# Patient Record
Sex: Female | Born: 1960
Health system: Southern US, Community
[De-identification: ages and names within clinical notes are randomized; demographics above are authoritative.]

## PROBLEM LIST (undated history)

## (undated) DIAGNOSIS — T7840XA Allergy, unspecified, initial encounter: Secondary | ICD-10-CM

## (undated) DIAGNOSIS — I10 Essential (primary) hypertension: Secondary | ICD-10-CM

## (undated) DIAGNOSIS — K219 Gastro-esophageal reflux disease without esophagitis: Secondary | ICD-10-CM

## (undated) DIAGNOSIS — G43909 Migraine, unspecified, not intractable, without status migrainosus: Secondary | ICD-10-CM

## (undated) DIAGNOSIS — F419 Anxiety disorder, unspecified: Secondary | ICD-10-CM

## (undated) HISTORY — DX: Gastro-esophageal reflux disease without esophagitis: K21.9

## (undated) HISTORY — DX: Essential (primary) hypertension: I10

## (undated) HISTORY — DX: Migraine, unspecified, not intractable, without status migrainosus: G43.909

## (undated) HISTORY — DX: Anxiety disorder, unspecified: F41.9

## (undated) HISTORY — DX: Allergy, unspecified, initial encounter: T78.40XA

---

## 2004-03-06 ENCOUNTER — Ambulatory Visit: Payer: Self-pay

## 2004-10-17 ENCOUNTER — Emergency Department (HOSPITAL_COMMUNITY): Admission: EM | Admit: 2004-10-17 | Discharge: 2004-10-18 | Payer: Self-pay | Admitting: Emergency Medicine

## 2005-06-04 ENCOUNTER — Ambulatory Visit: Payer: Self-pay

## 2006-08-26 ENCOUNTER — Ambulatory Visit: Payer: Self-pay

## 2007-11-30 ENCOUNTER — Ambulatory Visit: Payer: Self-pay

## 2009-06-21 ENCOUNTER — Ambulatory Visit: Payer: Self-pay | Admitting: Family Medicine

## 2010-07-19 ENCOUNTER — Other Ambulatory Visit (HOSPITAL_COMMUNITY): Payer: Self-pay | Admitting: Internal Medicine

## 2010-07-19 DIAGNOSIS — Z1231 Encounter for screening mammogram for malignant neoplasm of breast: Secondary | ICD-10-CM

## 2010-08-13 ENCOUNTER — Ambulatory Visit (HOSPITAL_COMMUNITY)
Admission: RE | Admit: 2010-08-13 | Discharge: 2010-08-13 | Disposition: A | Discharge status: Home / Self Care | Payer: Self-pay | Source: Ambulatory Visit | Attending: Internal Medicine | Admitting: Internal Medicine

## 2010-08-13 DIAGNOSIS — Z1231 Encounter for screening mammogram for malignant neoplasm of breast: Secondary | ICD-10-CM

## 2011-08-20 ENCOUNTER — Other Ambulatory Visit (HOSPITAL_COMMUNITY): Payer: Self-pay | Admitting: Internal Medicine

## 2011-09-19 ENCOUNTER — Other Ambulatory Visit (HOSPITAL_COMMUNITY): Payer: Self-pay | Admitting: Internal Medicine

## 2011-09-19 DIAGNOSIS — Z1231 Encounter for screening mammogram for malignant neoplasm of breast: Secondary | ICD-10-CM

## 2011-10-02 ENCOUNTER — Ambulatory Visit (HOSPITAL_COMMUNITY)
Admission: RE | Admit: 2011-10-02 | Discharge: 2011-10-02 | Disposition: A | Discharge status: Home / Self Care | Payer: Self-pay | Source: Ambulatory Visit | Attending: Internal Medicine | Admitting: Internal Medicine

## 2011-10-02 DIAGNOSIS — Z1231 Encounter for screening mammogram for malignant neoplasm of breast: Secondary | ICD-10-CM

## 2015-04-03 ENCOUNTER — Ambulatory Visit (INDEPENDENT_AMBULATORY_CARE_PROVIDER_SITE_OTHER): Payer: BLUE CROSS/BLUE SHIELD | Admitting: Internal Medicine

## 2015-04-03 ENCOUNTER — Encounter: Payer: Self-pay | Admitting: Internal Medicine

## 2015-04-03 VITALS — BP 150/80 | HR 82 | Temp 98.4°F | Ht 62.0 in | Wt 136.8 lb

## 2015-04-03 DIAGNOSIS — I10 Essential (primary) hypertension: Secondary | ICD-10-CM

## 2015-04-03 DIAGNOSIS — Z0000 Encounter for general adult medical examination without abnormal findings: Secondary | ICD-10-CM

## 2015-04-03 DIAGNOSIS — Z23 Encounter for immunization: Secondary | ICD-10-CM | POA: Diagnosis not present

## 2015-04-03 MED ORDER — LOSARTAN POTASSIUM 50 MG PO TABS: 50.0000 mg | ORAL_TABLET | Freq: Every day | ORAL | Status: DC

## 2015-04-03 NOTE — Assessment & Plan Note (Signed)
Overdue colonoscopy, pap smear, and mammogram. - GI referral placed for colonoscopy - Pt given number to call to schedule mammogram - TDAP given today - Pt to return for pap

## 2015-04-03 NOTE — Progress Notes (Signed)
   [facility] Phone: [phone]  Subjective:  Pt presents to establish care and to meet her new PCP.   -Back Pain: This morning, she turned and felt a "catch" in her back. The pain is worse with twisting and coughing. She does not have any pain at rest. She has not tried any medications. She does not have any back pain normally. The pain is located on the left mid back.   -HTN: Used to be on Lisinopril 20mg  daily. She was lightheaded and dizzy all the time while on the medication. She also developed a new cough that went away when she stopped the Lisinopril. Before the Lisinopril, she was taking HCTZ 25mg  daily. She was taken off this because it was causing her blood sugars to be high. No chest pain, no lower extremity edema.   -Health care maintenance: Has not had a mammogram since 2013. Has not had a Pap since 2012. Has never had a colonoscopy.  ROS: See HPI for pertinent positives and negatives  Past Medical History- HTN, seasonal allergies, brain aneurysm in 1995, carpal tunnel  Past Surgical History- None   Medications- Does not currently take any medications  Allergies- NKDA  Family history- Mother with Alzheimer's, lung cancer, and colon cancer. Father with stroke, HTN.  Social history- patient is a current smoker. She smokes 2 packs per week since since 1984. She quit for 1.5 years, but restarted when she was stressed. Smokes marijuana twice a month. Exercises 3 times a week- jumping jacks, squats, body weight exercises.   Objective: Wt 136 lb 12.8 oz (62.052 kg)  LMP 07/18/2012 Gen: NAD, alert, cooperative with exam HEENT: NCAT, EOMI, MMM Neck: FROM, supple CV: RRR, no murmur Resp: CTABL, no wheezes, normal work of breathing GI: SNTND, BS present, no guarding or organomegaly Msk: No edema, warm, normal tone, moves UE/LE spontaneously Neuro: Alert and oriented, no gross deficits Skin: No rashes, no lesions Psych: Appropriate  behavior  Assessment/Plan: Hypertension: BP 161/93, 150/80 on recheck. Pt has taken Lisinopril and HCTZ in the past. Lisinopril stopped because caused cough and dizziness. HCTZ stopped because it was causing hyperglycemia. - Will prescribe Losartan 50mg  daily - Will check CMET today - Follow-up in 1 month for BP check and hypertension follow-up  Health Care Maintenance: Overdue colonoscopy, pap smear, and mammogram. - GI referral placed for colonoscopy - Pt given number to call to schedule mammogram - TDAP given today - Pt to return for pap   Hyman Bible, MD PGY-1

## 2015-04-03 NOTE — Assessment & Plan Note (Signed)
BP 161/93, 150/80 on recheck. Pt has taken Lisinopril and HCTZ in the past. Lisinopril stopped because caused cough and dizziness. HCTZ stopped because it was causing hyperglycemia. - Will prescribe Losartan 50mg  daily - Will check CMET today - Follow-up in 1 month for BP check and hypertension follow-up

## 2015-04-03 NOTE — Patient Instructions (Signed)
It was so nice to meet you!  I have sent in a prescription for a medication called Cozaar. Please take 1 tablet (50mg ) daily. If you have trouble tolerating this medication, please give [place] a call.   We have drawn some labs today. I will send you the results in the mail.  You should hear from our office in 2 weeks to schedule your colonoscopy.  Please get your mammogram done whenever is best for you.  We will see you back for follow-up of your blood pressure and to have your pap smear done.

## 2015-04-04 ENCOUNTER — Encounter: Payer: Self-pay | Admitting: Internal Medicine

## 2015-04-04 LAB — COMPLETE METABOLIC PANEL WITH GFR
ALBUMIN: 4.7 g/dL (ref 3.6–5.1)
ALK PHOS: 54 U/L (ref 33–130)
ALT: 25 U/L (ref 6–29)
AST: 21 U/L (ref 10–35)
BUN: 10 mg/dL (ref 7–25)
CHLORIDE: 103 mmol/L (ref 98–110)
CO2: 24 mmol/L (ref 20–31)
Calcium: 9.7 mg/dL (ref 8.6–10.4)
Creat: 0.8 mg/dL (ref 0.50–1.05)
GFR, Est African American: >89 mL/min (ref >=60)
GFR, Est Non African American: 83 mL/min (ref >=60)
GLUCOSE: 86 mg/dL (ref 65–99)
POTASSIUM: 3.8 mmol/L (ref 3.5–5.3)
SODIUM: 140 mmol/L (ref 135–146)
Total Bilirubin: 0.6 mg/dL (ref 0.2–1.2)
Total Protein: 7.2 g/dL (ref 6.1–8.1)

## 2015-04-04 LAB — HIV ANTIBODY (ROUTINE TESTING W REFLEX): HIV: NONREACTIVE

## 2015-04-04 LAB — HEPATITIS C ANTIBODY: HCV Ab: NEGATIVE

## 2015-04-06 ENCOUNTER — Telehealth: Payer: Self-pay | Admitting: Internal Medicine

## 2015-04-06 NOTE — Telephone Encounter (Signed)
Pt called because she had a missed call from [place]. I didn't see any messages. She was wanting her lab results. I told her what the letter said and she said if that is why you were calling she knows the results and that a letter is in the mail. If this is for something then please call. jw

## 2015-04-17 ENCOUNTER — Telehealth: Payer: Self-pay | Admitting: Internal Medicine

## 2015-04-17 NOTE — Telephone Encounter (Signed)
Patient states that she has been feeling tired, weak, has had a stuffy nose, and generally feels bad since starting losartan. Hasnt taken any in the past 3 days but states she still hasnt noticed much change in her symptoms. Wants to know if there is a different medication she could take. Message to MD.

## 2015-04-17 NOTE — Telephone Encounter (Signed)
Will forward to triage pool.  Jazmin Hartsell,CMA

## 2015-04-17 NOTE — Telephone Encounter (Signed)
I spoke with Sherry Collins. She started taking the Losartan 2 weeks ago and didn't have any problems until 3 days ago when she started having diarrhea, runny nose, muscle aches, etc. It sounds like she has developed a virus and this is not actually related to the Losartan. I spoke with her about this and she will restart the Losartan. She will let us know if her symptoms do not improve. Thanks!

## 2015-04-17 NOTE — Telephone Encounter (Signed)
Please call patient regarding medication recently prescribed for her. Think the medication caused some adverse side effects.

## 2017-10-26 ENCOUNTER — Other Ambulatory Visit: Payer: Self-pay | Admitting: Internal Medicine

## 2017-10-26 DIAGNOSIS — Z1231 Encounter for screening mammogram for malignant neoplasm of breast: Secondary | ICD-10-CM

## 2017-11-10 ENCOUNTER — Telehealth: Payer: Self-pay | Admitting: Family Medicine

## 2017-11-10 NOTE — Telephone Encounter (Signed)
Spoke with pt reminding them of/confirming their appt for tomorrow 11/11/2017. -CH °

## 2017-11-11 ENCOUNTER — Encounter: Payer: Self-pay | Admitting: Family Medicine

## 2017-11-11 ENCOUNTER — Ambulatory Visit (INDEPENDENT_AMBULATORY_CARE_PROVIDER_SITE_OTHER): Payer: BLUE CROSS/BLUE SHIELD | Admitting: Family Medicine

## 2017-11-11 ENCOUNTER — Other Ambulatory Visit: Payer: Self-pay

## 2017-11-11 VITALS — BP 134/68 | HR 74 | Temp 98.7°F | Ht 62.0 in | Wt 140.0 lb

## 2017-11-11 DIAGNOSIS — Z1211 Encounter for screening for malignant neoplasm of colon: Secondary | ICD-10-CM | POA: Diagnosis not present

## 2017-11-11 DIAGNOSIS — I10 Essential (primary) hypertension: Secondary | ICD-10-CM | POA: Diagnosis not present

## 2017-11-11 DIAGNOSIS — Z131 Encounter for screening for diabetes mellitus: Secondary | ICD-10-CM

## 2017-11-11 LAB — POCT GLYCOSYLATED HEMOGLOBIN (HGB A1C): Hemoglobin A1C: 5.8 % — AB (ref 4.0–5.6)

## 2017-11-11 NOTE — Patient Instructions (Signed)
It was great seeing you today! We have addressed the following issues today  1. I made a referral to GI and they will call you to schedule an appointment. 2. Make sure you get you mammogram 3. Your Blood pressure is well controlled we will continue to monitor 4. Schedule and appointment for your pap smear  If we did any lab work today, and the results require attention, either me or my nurse will get in touch with you. If everything is normal, you will get a letter in mail and a message via . If you don't hear from [place] in two weeks, please give [place] a call. Otherwise, we look forward to seeing you again at your next visit. If you have any questions or concerns before then, please call the clinic at [phone].  Please bring all your medications to every doctors visit  Sign up for My Chart to have easy access to your labs results, and communication with your Primary care physician. Please ask Front Desk for some assistance.   Please check-out at the front desk before leaving the clinic.    Take Care,   Dr. Andy Gauss

## 2017-11-11 NOTE — Progress Notes (Signed)
   Subjective:    Patient ID: Sherry Collins, female    DOB: [birth year], 57 y.o.   MRN: [id number]   CC: Colon screening   HPI: Patient is a 57 yo female who presents today requesting colon cancer screening. Patient has not been seen in clinic in two years and has no acute complaints today. She was called by her insurance company and was told that she needed colon cancer screening. Patient denies blood in her stool, family history of colon cancer, abdominal pain, nausea or vomiting. Patient only medical problem was hypertension and she reports she stop taking her losartan and few months ago because "it was not agreeing with her body". She denies any palpitations, chest pain, shortness of breath, vision changes.   Smoking status reviewed   ROS: all other systems were reviewed and are negative other than in the HPI   Past Medical History:  Diagnosis Date  . Allergy   . Hypertension   . Migraine     History reviewed. No pertinent surgical history.  Past medical history, surgical, family, and social history reviewed and updated in the EMR as appropriate.  Objective:  BP 134/68   Pulse 74   Temp 98.7 F (37.1 C)   Ht 5\' 2"  (1.575 m)   Wt 140 lb (63.5 kg)   BMI 25.61 kg/m   Vitals and nursing note reviewed  General: NAD, pleasant, able to participate in exam Cardiac: RRR, normal heart sounds, no murmurs. 2+ radial and PT pulses bilaterally Respiratory: CTAB, normal effort, No wheezes, rales or rhonchi Abdomen: soft, nontender, nondistended, no hepatic or splenomegaly, +BS Extremities: no edema or cyanosis. WWP. Skin: warm and dry, no rashes noted Neuro: alert and oriented x4, no focal deficits Psych: Normal affect and mood   Assessment & Plan:    Screening for colon cancer Patient is 57 yo and never had a coloscopy, no red flags. Order for colonoscopy placed. Patient inform will follow up on results  Essential hypertension Initial BP was 142/80, repeat BP 134/68.Will  hold off on restarting BP meds. Patient has been intolerant of lisinopril, and HCTZ in the past and has recently discontinue losartan.  --Order for CMET placed will follow up  Patient will make follow up appointment for Pap smear.  Marjie Skiff, MD [place] PGY-3

## 2017-11-11 NOTE — Assessment & Plan Note (Signed)
Patient is 57 yo and never had a coloscopy, no red flags. Order for colonoscopy placed. Patient inform will follow up on results

## 2017-11-11 NOTE — Assessment & Plan Note (Signed)
Initial BP was 142/80, repeat BP 134/68.Will hold off on restarting BP meds. Patient has been intolerant of lisinopril, and HCTZ in the past and has recently discontinue losartan.  --Order for CMET placed will follow up

## 2017-11-12 LAB — COMPREHENSIVE METABOLIC PANEL
ALK PHOS: 64 IU/L (ref 39–117)
ALT: 23 IU/L (ref 0–32)
AST: 21 IU/L (ref 0–40)
Albumin/Globulin Ratio: 2.3 — ABNORMAL HIGH (ref 1.2–2.2)
Albumin: 4.8 g/dL (ref 3.5–5.5)
BUN/Creatinine Ratio: 13 (ref 9–23)
BUN: 10 mg/dL (ref 6–24)
Bilirubin Total: 0.3 mg/dL (ref 0.0–1.2)
CALCIUM: 9.9 mg/dL (ref 8.7–10.2)
CO2: 24 mmol/L (ref 20–29)
Chloride: 102 mmol/L (ref 96–106)
Creatinine, Ser: 0.77 mg/dL (ref 0.57–1.00)
GFR calc Af Amer: 98 mL/min/{1.73_m2} (ref >59)
GFR, EST NON AFRICAN AMERICAN: 85 mL/min/{1.73_m2} (ref >59)
Globulin, Total: 2.1 g/dL (ref 1.5–4.5)
Glucose: 114 mg/dL — ABNORMAL HIGH (ref 65–99)
Potassium: 4.1 mmol/L (ref 3.5–5.2)
SODIUM: 141 mmol/L (ref 134–144)
Total Protein: 6.9 g/dL (ref 6.0–8.5)

## 2017-11-20 ENCOUNTER — Encounter: Payer: Self-pay | Admitting: Gastroenterology

## 2017-11-25 ENCOUNTER — Ambulatory Visit: Payer: BLUE CROSS/BLUE SHIELD | Admitting: Licensed Clinical Social Worker

## 2017-11-25 ENCOUNTER — Other Ambulatory Visit (HOSPITAL_COMMUNITY)
Admission: RE | Admit: 2017-11-25 | Discharge: 2017-11-25 | Disposition: A | Discharge status: Home / Self Care | Payer: BLUE CROSS/BLUE SHIELD | Source: Ambulatory Visit | Attending: Family Medicine | Admitting: Family Medicine

## 2017-11-25 ENCOUNTER — Ambulatory Visit (INDEPENDENT_AMBULATORY_CARE_PROVIDER_SITE_OTHER): Payer: BLUE CROSS/BLUE SHIELD | Admitting: Family Medicine

## 2017-11-25 ENCOUNTER — Other Ambulatory Visit: Payer: Self-pay

## 2017-11-25 ENCOUNTER — Encounter: Payer: Self-pay | Admitting: Family Medicine

## 2017-11-25 VITALS — BP 130/72 | HR 79 | Temp 98.1°F | Wt 141.4 lb

## 2017-11-25 DIAGNOSIS — F419 Anxiety disorder, unspecified: Secondary | ICD-10-CM

## 2017-11-25 DIAGNOSIS — Z23 Encounter for immunization: Secondary | ICD-10-CM | POA: Diagnosis not present

## 2017-11-25 DIAGNOSIS — Z124 Encounter for screening for malignant neoplasm of cervix: Secondary | ICD-10-CM | POA: Insufficient documentation

## 2017-11-25 DIAGNOSIS — R7303 Prediabetes: Secondary | ICD-10-CM | POA: Diagnosis not present

## 2017-11-25 NOTE — Assessment & Plan Note (Signed)
Pap smear collected.  Patient tolerated procedure well.  We will follow-up on results.

## 2017-11-25 NOTE — Patient Instructions (Signed)
  Diet Recommendations for Diabetes   Starchy (carb) foods: Bread, rice, pasta, potatoes, corn, cereal, grits, crackers, bagels, muffins, all baked goods.  (Fruits, milk, and yogurt also have carbohydrate, but most of these foods will not spike your blood sugar as the starchy foods will.)  A few fruits do cause high blood sugars; use small portions of bananas (limit to 1/2 at a time), grapes, watermelon, oranges, and most tropical fruits.    Protein foods: Meat, fish, poultry, eggs, dairy foods, and beans such as pinto and kidney beans (beans also provide carbohydrate).   1. Eat at least 3 meals and 1-2 snacks per day. Never go more than 4-5 hours while awake without eating. Eat breakfast within the first hour of getting up.   2. Limit starchy foods to TWO per meal and ONE per snack. ONE portion of a starchy  food is equal to the following:   - ONE slice of bread (or its equivalent, such as half of a hamburger bun).   - 1/2 cup of a "scoopable" starchy food such as potatoes or rice.   - 15 grams of carbohydrate as shown on food label.  3. Include at every meal: a protein food, a carb food, and vegetables and/or fruit.   - Obtain twice the volume of veg's as protein or carbohydrate foods for both lunch and dinner.   - Fresh or frozen veg's are best.   - Keep frozen veg's on hand for a quick vegetable serving.      

## 2017-11-25 NOTE — Assessment & Plan Note (Signed)
A1c check at next office visit was 5.8.  Discussed with patient need for therapeutic lifestyle changes given prediabetes status.  Handout for diabetic diet given.  Will recheck A1c in 3 months.

## 2017-11-25 NOTE — Progress Notes (Signed)
   Subjective:    Patient ID: Sherry Collins, female    DOB: 1960/05/29, 57 y.o.   MRN: [id number]   CC: Overdue Pap smear  HPI: Patient is a 57 year old female who presents today for Pap smear.  Patient has no acute complaints today.  Since last office visit she has scheduled a mammogram on 11/26.  She also has a colonoscopy scheduled with [facility] in mid December.  After this Pap smear patient will be up-to-date on health maintenance.  She currently denies any chest pain, shortness of breath, pain, nausea, vomiting, fever, chills discharge or vaginal bleeding.  Smoking status reviewed   ROS: all other systems were reviewed and are negative other than in the HPI   Past Medical History:  Diagnosis Date  . Allergy   . Hypertension   . Migraine     History reviewed. No pertinent surgical history.  Past medical history, surgical, family, and social history reviewed and updated in the EMR as appropriate.  Objective:  BP 130/72   Pulse 79   Temp 98.1 F (36.7 C) (Oral)   Wt 141 lb 6.4 oz (64.1 kg)   SpO2 99%   BMI 25.86 kg/m   Vitals and nursing note reviewed  General: NAD, pleasant, able to participate in exam Cardiac: RRR, normal heart sounds, no murmurs. 2+ radial and PT pulses bilaterally Respiratory: CTAB, normal effort, No wheezes, rales or rhonchi Abdomen: soft, nontender, nondistended, no hepatic or splenomegaly, +BS  Female genitalia: normal external genitalia, vulva, vagina, cervix, uterus and adnexa Cervix: normal appearing cervix without discharge or lesions Extremities: no edema or cyanosis. WWP. Skin: warm and dry, no rashes noted Neuro: alert and oriented x4, no focal deficits Psych: Normal affect and mood   Assessment & Plan:   Screening for malignant neoplasm of cervix Pap smear collected.  Patient tolerated procedure well.  We will follow-up on results.  Prediabetes A1c check at next office visit was 5.8.  Discussed with patient need for  therapeutic lifestyle changes given prediabetes status.  Handout for diabetic diet given.  Will recheck A1c in 3 months.    Marjie Skiff, MD [place] PGY-3
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
## 2017-11-27 LAB — CYTOLOGY - PAP
DIAGNOSIS: NEGATIVE
HPV: NOT DETECTED

## 2017-12-01 ENCOUNTER — Ambulatory Visit
Admission: RE | Admit: 2017-12-01 | Discharge: 2017-12-01 | Disposition: A | Discharge status: Home / Self Care | Payer: BLUE CROSS/BLUE SHIELD | Source: Ambulatory Visit | Attending: Internal Medicine | Admitting: Internal Medicine

## 2017-12-01 DIAGNOSIS — Z1231 Encounter for screening mammogram for malignant neoplasm of breast: Secondary | ICD-10-CM

## 2017-12-18 ENCOUNTER — Ambulatory Visit (AMBULATORY_SURGERY_CENTER): Payer: Self-pay | Admitting: *Deleted

## 2017-12-18 ENCOUNTER — Encounter: Payer: Self-pay | Admitting: Gastroenterology

## 2017-12-18 ENCOUNTER — Other Ambulatory Visit: Payer: Self-pay

## 2017-12-18 VITALS — Ht 63.0 in | Wt 137.2 lb

## 2017-12-18 DIAGNOSIS — Z1211 Encounter for screening for malignant neoplasm of colon: Secondary | ICD-10-CM

## 2017-12-18 MED ORDER — SUPREP BOWEL PREP KIT 17.5-3.13-1.6 GM/177ML PO SOLN: 1.0000 | Freq: Once | ORAL | 0 refills | Status: AC

## 2017-12-18 NOTE — Progress Notes (Signed)
No egg or soy allergy known to patient  Never had surgery No diet pills per patient No home 02 use per patient  No blood thinners per patient  Pt denies issues with constipation  No A fib or A flutter  EMMI video offered, patient does not have an email account, brochure given to the patient. suprep sample given, lot # [id number], exp 11/21

## 2018-01-01 ENCOUNTER — Encounter: Payer: Self-pay | Admitting: Gastroenterology

## 2018-01-01 ENCOUNTER — Ambulatory Visit (AMBULATORY_SURGERY_CENTER): Payer: BLUE CROSS/BLUE SHIELD | Admitting: Gastroenterology

## 2018-01-01 DIAGNOSIS — Z1211 Encounter for screening for malignant neoplasm of colon: Secondary | ICD-10-CM | POA: Diagnosis not present

## 2018-01-01 DIAGNOSIS — D123 Benign neoplasm of transverse colon: Secondary | ICD-10-CM

## 2018-01-01 DIAGNOSIS — K635 Polyp of colon: Secondary | ICD-10-CM | POA: Diagnosis not present

## 2018-01-01 MED ORDER — SODIUM CHLORIDE 0.9 % IV SOLN: 500.0000 mL | Freq: Once | INTRAVENOUS | Status: DC

## 2018-01-01 NOTE — Progress Notes (Signed)
A/ox3, pleased with MAC, report to RN 

## 2018-01-01 NOTE — Progress Notes (Signed)
Pt's states no medical or surgical changes since previsit or office visit.  Disregard the polyps smart set.  It had been entered before I was in the chart.

## 2018-01-01 NOTE — Op Note (Signed)
[place] Patient Name: Sherry Collins Procedure Date: 01/01/2018 12:10 PM MRN: [id number] Endoscopist: Remo Lipps P. Havery Moros , MD Age: 57 Referring MD:  Date of Birth: 1960-02-25 Gender: Female Account #: [id number] Procedure:                Colonoscopy Indications:              Screening for colorectal malignant neoplasm, This                            is the patient's first colonoscopy Medicines:                Monitored Anesthesia Care Procedure:                Pre-Anesthesia Assessment:                           - Prior to the procedure, a History and Physical                            was performed, and patient medications and                            allergies were reviewed. The patient's tolerance of                            previous anesthesia was also reviewed. The risks                            and benefits of the procedure and the sedation                            options and risks were discussed with the patient.                            All questions were answered, and informed consent                            was obtained. Prior Anticoagulants: The patient has                            taken no previous anticoagulant or antiplatelet                            agents. ASA Grade Assessment: II - A patient with                            mild systemic disease. After reviewing the risks                            and benefits, the patient was deemed in                            satisfactory condition to undergo the procedure.  After obtaining informed consent, the colonoscope                            was passed under direct vision. Throughout the                            procedure, the patient's blood pressure, pulse, and                            oxygen saturations were monitored continuously. The                            Colonoscope was introduced through the anus and                            advanced to the  the cecum, identified by                            appendiceal orifice and ileocecal valve. The                            colonoscopy was performed without difficulty. The                            patient tolerated the procedure well. The quality                            of the bowel preparation was good. The ileocecal                            valve, appendiceal orifice, and rectum were                            photographed. Scope In: 12:16:07 PM Scope Out: 12:40:17 PM Scope Withdrawal Time: 0 hours 21 minutes 3 seconds  Total Procedure Duration: 0 hours 24 minutes 10 seconds  Findings:                 The perianal and digital rectal examinations were                            normal.                           A diminutive polyp was found in the transverse                            colon. The polyp was sessile. The polyp was removed                            with a cold biopsy forceps. Resection and retrieval                            were complete.  Multiple small-mouthed diverticula were found in                            the left colon and right colon.                           Internal hemorrhoids were found.                           The exam was otherwise without abnormality. Complications:            No immediate complications. Estimated blood loss:                            Minimal. Estimated Blood Loss:     Estimated blood loss was minimal. Impression:               - One diminutive polyp in the transverse colon,                            removed with a cold biopsy forceps. Resected and                            retrieved.                           - Diverticulosis in the left colon and in the right                            colon.                           - Internal hemorrhoids.                           - The examination was otherwise normal. Recommendation:           - Patient has a contact number available for                             emergencies. The signs and symptoms of potential                            delayed complications were discussed with the                            patient. Return to normal activities tomorrow.                            Written discharge instructions were provided to the                            patient.                           - Resume previous diet.                           -  Continue present medications.                           - Await pathology results. Remo Lipps P. Asti Mackley, MD 01/01/2018 12:43:26 PM This report has been signed electronically.

## 2018-01-01 NOTE — Progress Notes (Signed)
Called to room to assist during endoscopic procedure.  Patient ID and intended procedure confirmed with present staff. Received instructions for my participation in the procedure from the performing physician.  

## 2018-01-01 NOTE — Patient Instructions (Signed)
YOU HAD AN ENDOSCOPIC PROCEDURE TODAY AT THE [facility]:   Refer to the procedure report that was given to you for any specific questions about what was found during the examination.  If the procedure report does not answer your questions, please call your gastroenterologist to clarify.  If you requested that your care partner not be given the details of your procedure findings, then the procedure report has been included in a sealed envelope for you to review at your convenience later.  YOU SHOULD EXPECT: Some feelings of bloating in the abdomen. Passage of more gas than usual.  Walking can help get rid of the air that was put into your GI tract during the procedure and reduce the bloating. If you had a lower endoscopy (such as a colonoscopy or flexible sigmoidoscopy) you may notice spotting of blood in your stool or on the toilet paper. If you underwent a bowel prep for your procedure, you may not have a normal bowel movement for a few days.  Please Note:  You might notice some irritation and congestion in your nose or some drainage.  This is from the oxygen used during your procedure.  There is no need for concern and it should clear up in a day or so.  SYMPTOMS TO REPORT IMMEDIATELY:   Following lower endoscopy (colonoscopy or flexible sigmoidoscopy):  Excessive amounts of blood in the stool  Significant tenderness or worsening of abdominal pains  Swelling of the abdomen that is new, acute  Fever of 100F or higher  For urgent or emergent issues, a gastroenterologist can be reached at any hour by calling [phone].   DIET:  We do recommend a small meal at first, but then you may proceed to your regular diet.  Drink plenty of fluids but you should avoid alcoholic beverages for 24 hours.  ACTIVITY:  You should plan to take it easy for the rest of today and you should NOT DRIVE or use heavy machinery until tomorrow (because of the sedation medicines used during the test).     FOLLOW UP: Our staff will call the number listed on your records the next business day following your procedure to check on you and address any questions or concerns that you may have regarding the information given to you following your procedure. If we do not reach you, we will leave a message.  However, if you are feeling well and you are not experiencing any problems, there is no need to return our call.  We will assume that you have returned to your regular daily activities without incident.  If any biopsies were taken you will be contacted by phone or by letter within the next 1-3 weeks.  Please call us at [phone] if you have not heard about the biopsies in 3 weeks.    SIGNATURES/CONFIDENTIALITY: You and/or your care partner have signed paperwork which will be entered into your electronic medical record.  These signatures attest to the fact that that the information above on your After Visit Summary has been reviewed and is understood.  Full responsibility of the confidentiality of this discharge information lies with you and/or your care-partner  Polyp, diverticulosis, and hemorrhoid information given.. 

## 2018-01-04 ENCOUNTER — Telehealth: Payer: Self-pay

## 2018-01-04 NOTE — Telephone Encounter (Signed)
Thanks for the message. Sounds like this may be musculoskeletal if positional, as there was no high risk interventions done for her procedure, we took a small biopsy. Given this is new, recommend she come in for basic labs - CBC, CMET, if you can order those and ask her to go to the lab. Will contact her with results. If she is having worsening pain, fever, intolerant of PO, etc, in the interim she should call back. Thanks. She can try some tylenol or ibuprofen in the interim and see if that helps. Thanks

## 2018-01-04 NOTE — Telephone Encounter (Signed)
Returned phone call to pt.  Advised pt thought her l pelvis pain was probably positional.  Dr. Havery Moros recommended she have blood work CBC & CMET.  Pt said she did take tylenol and it helped her pain she rated pain 5/10 now.  Pt said she would call her insurance company and see if they will pay for the blood work.  She said if it did not pay for it she did not want to have blood work done.  Pt said she will call back today or tomorrow and let us know if she wants to have blood work drawn.  If she does dedide to have it, we need to put the orders in Epic.  I am off tomorrow, I will let Randall Hiss, RN know about this phone note.  maw

## 2018-01-04 NOTE — Telephone Encounter (Signed)
  Follow up Call-  Call back number 01/01/2018  Post procedure Call Back phone  # [phone]  Permission to leave phone message Yes  Some recent data might be hidden     Patient questions:  Do you have a fever, pain , or abdominal swelling? Yes.   Pain Score  7 *  Have you tolerated food without any problems? Yes.    Have you been able to return to your normal activities? No.  Do you have any questions about your discharge instructions: Diet   No. Medications  No. Follow up visit  No.  Do you have questions or concerns about your Care? Yes.    Actions: * If pain score is 4 or above: No action needed, pain <4.  Pt ask what Diverticula were and I answered her questions.  She also reported complaint of pain in her "left pelvis area".  Pain began Friday evening and she said the pain comes and goes.  At the time of the follow up call she reported 7/10 pain.  I asked if she has done anything for the pain and she said "no".  I asked if she has had this pain in this area before.  Pt said no this was a new pain that started after her procedure Friday evening.  Pt felt like the pain was from the position she was laying in during the procedure.  Pt has not taking any medication for the pain either.  Please advise

## 2018-01-08 ENCOUNTER — Encounter: Payer: Self-pay | Admitting: Gastroenterology

## 2019-06-16 IMAGING — MG DIGITAL SCREENING BILATERAL MAMMOGRAM WITH CAD
4 series · 4 of 4 positions shown · non-contrast
Comparison: Previous exam(s).

CLINICAL DATA: Screening.

EXAM:
DIGITAL SCREENING BILATERAL MAMMOGRAM WITH CAD

[L MLO]
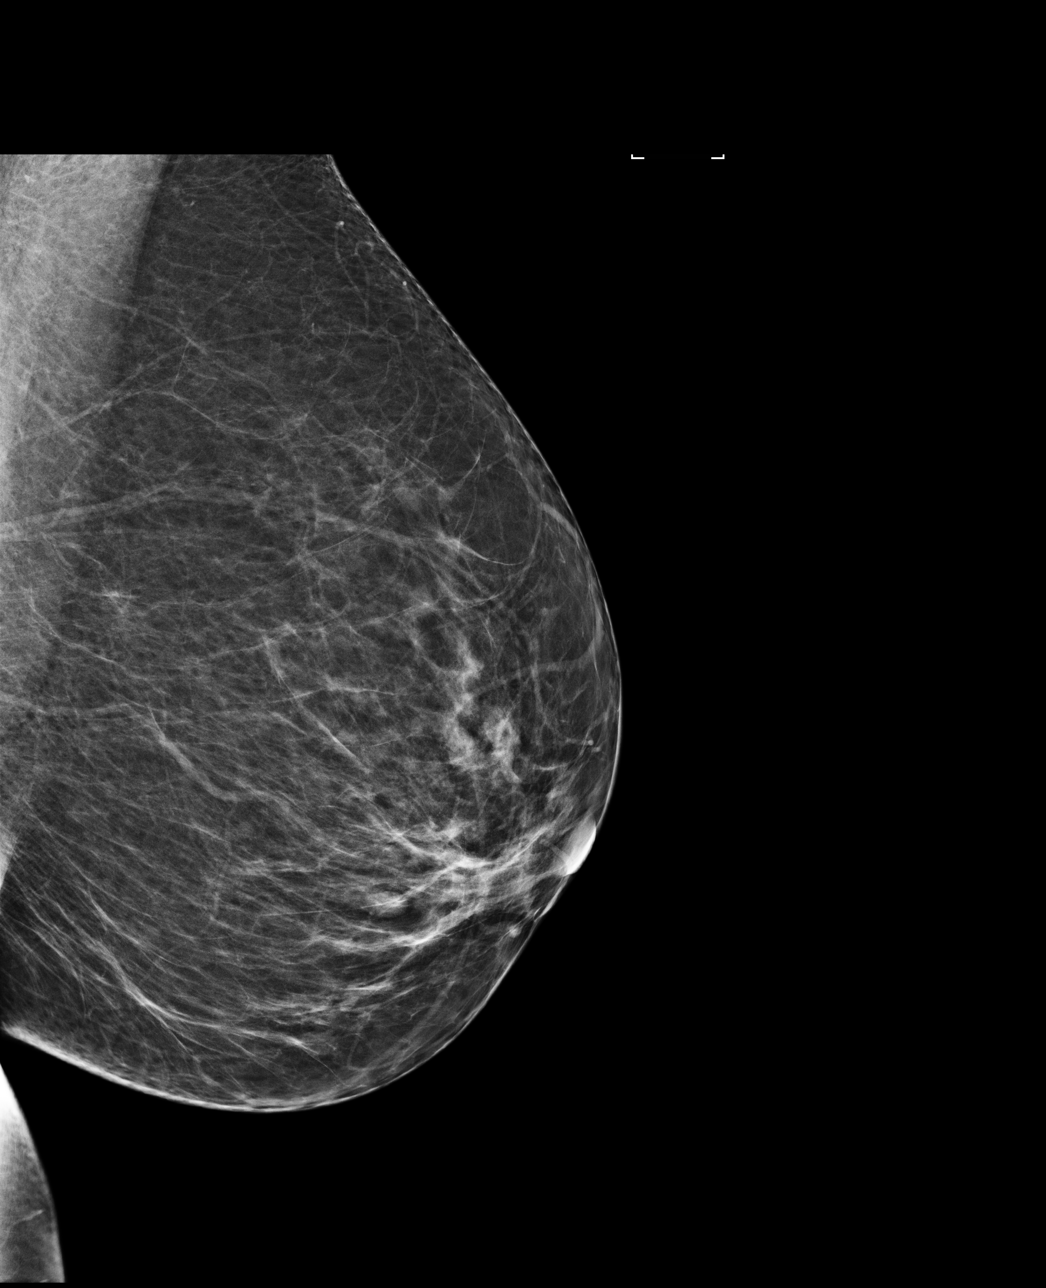

[R MLO]
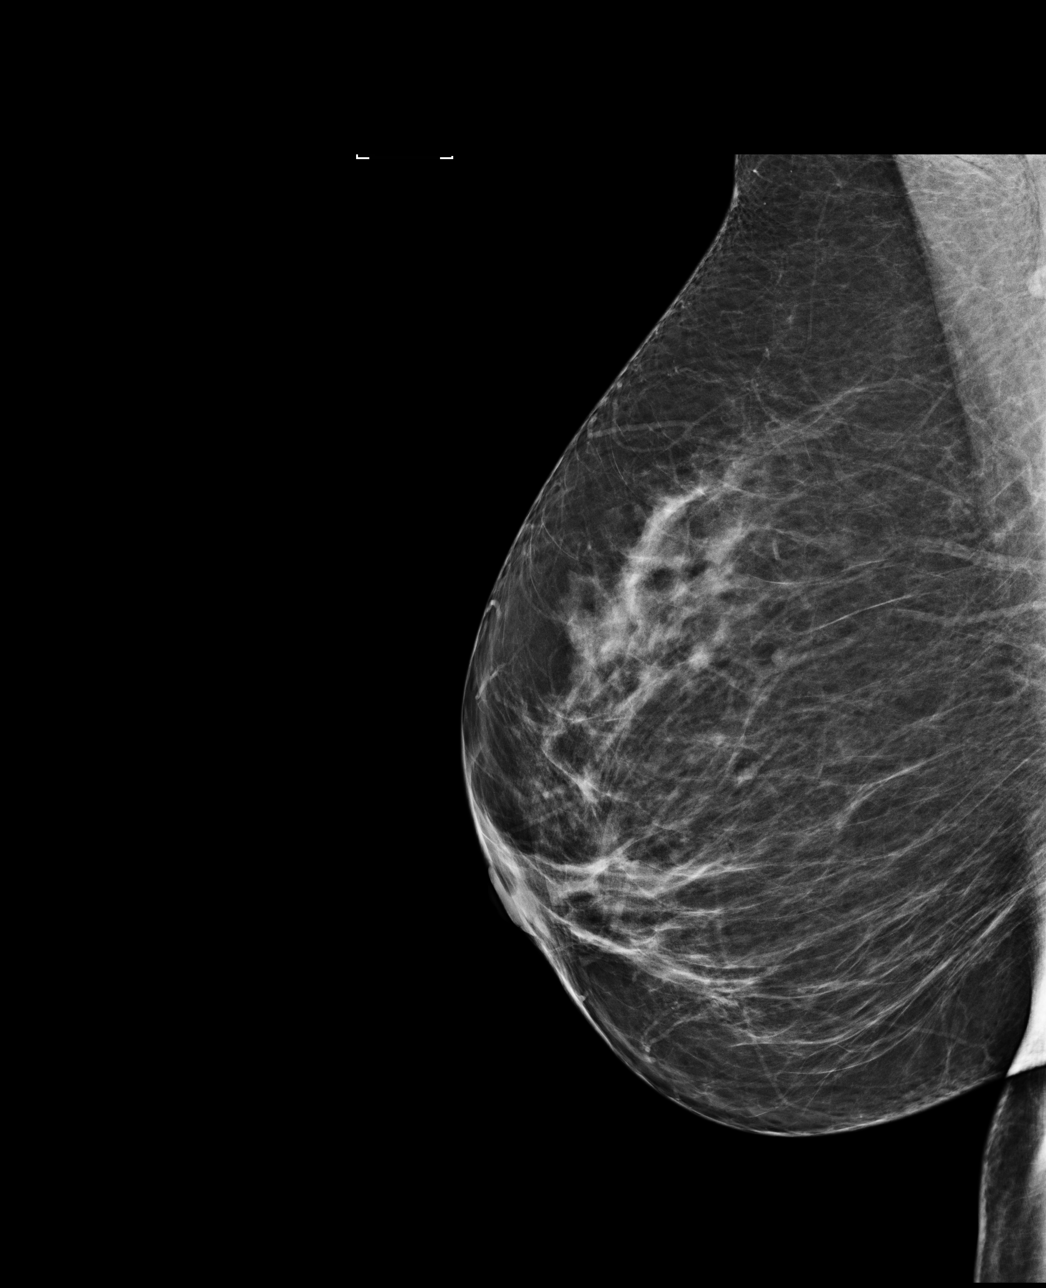

[L CC]
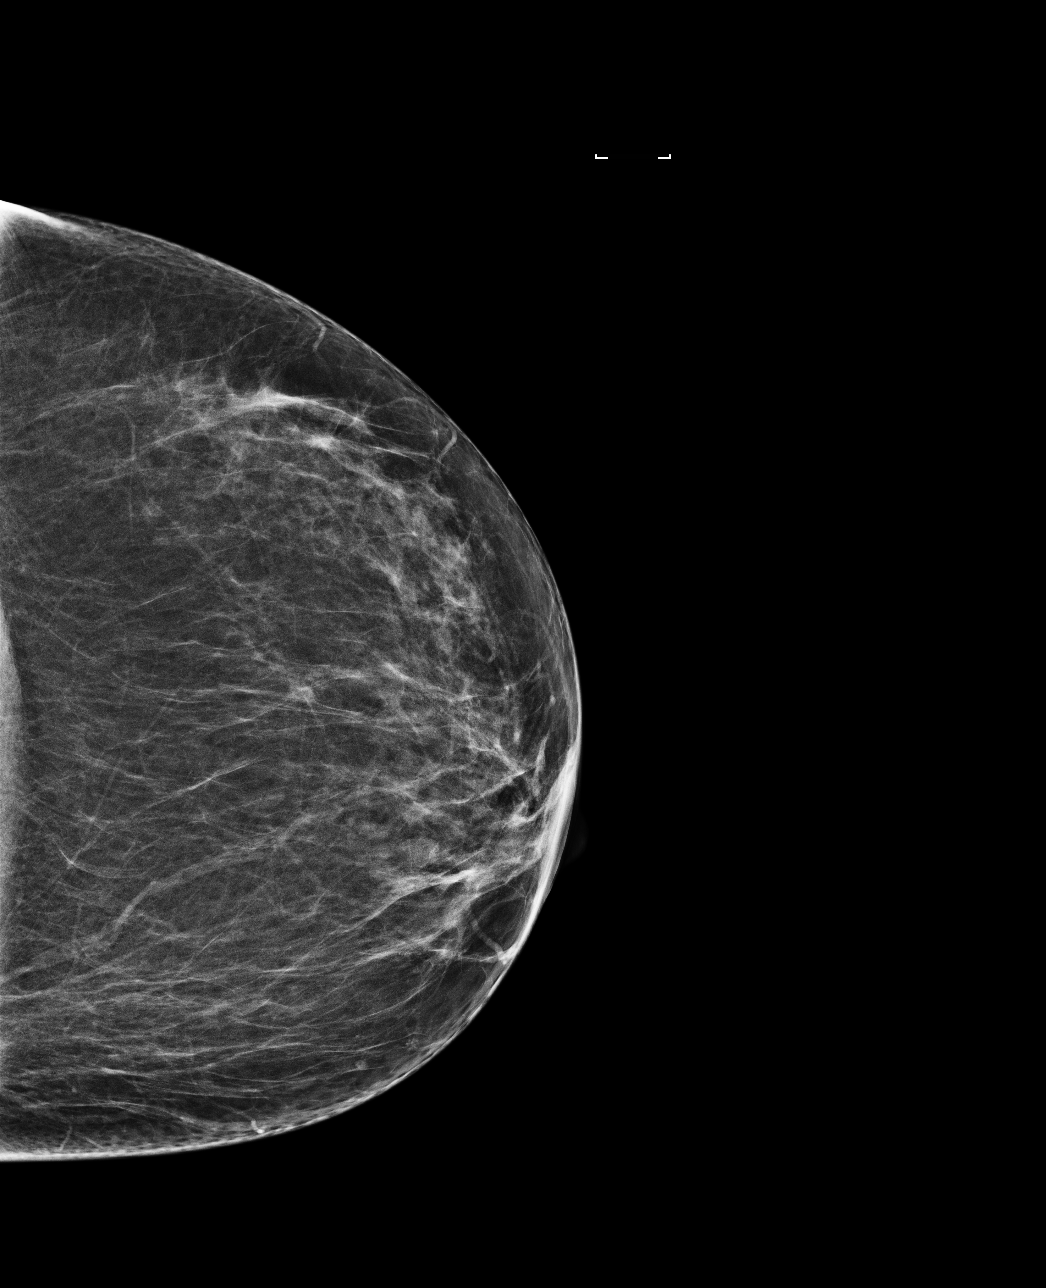

[R CC]
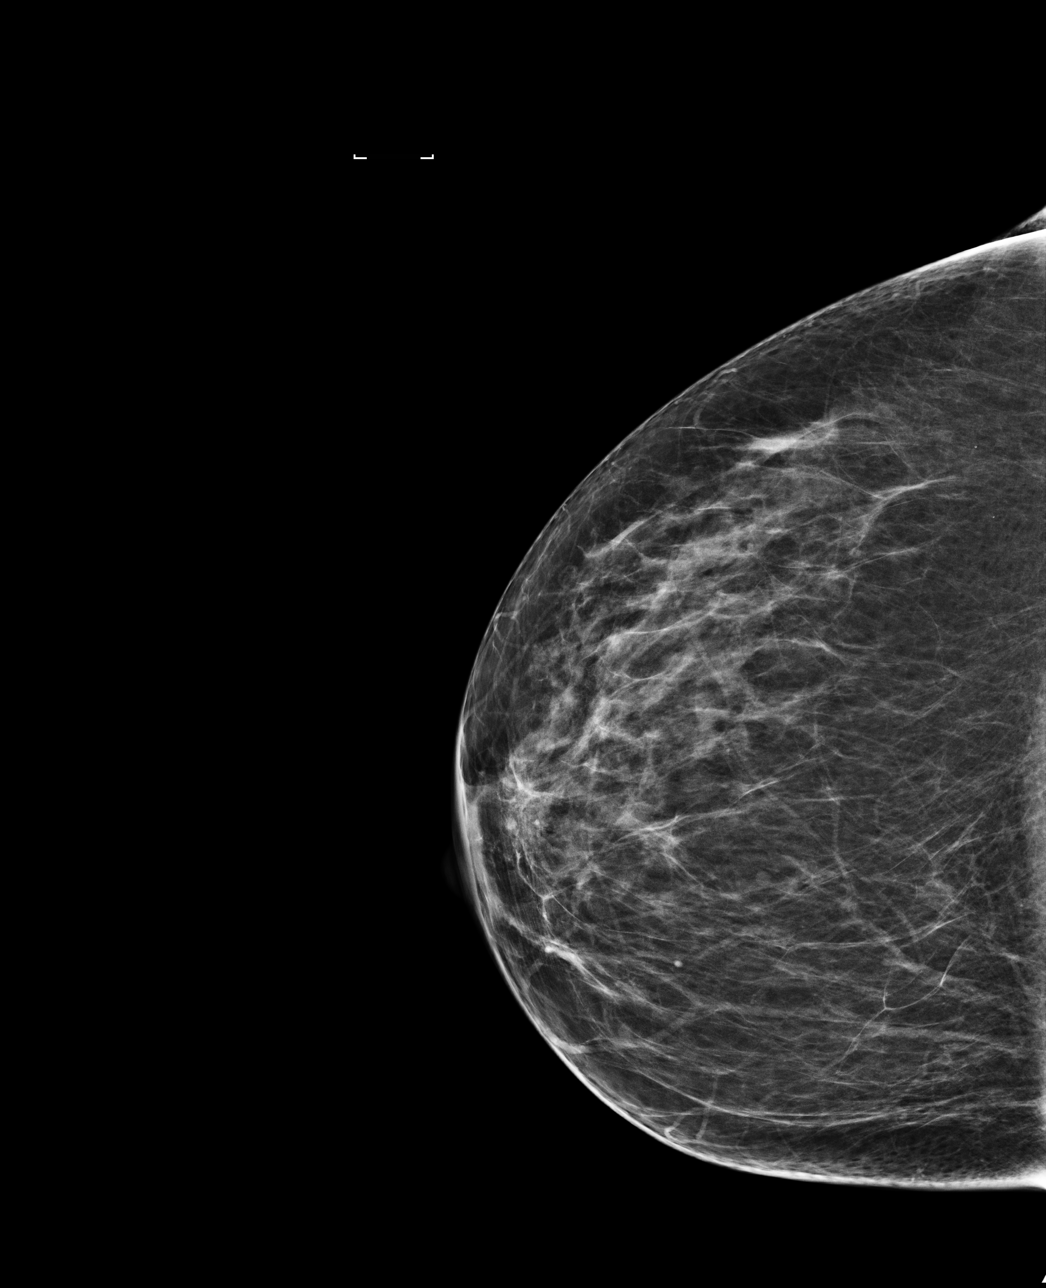

[4 of 4 positions shown; findings below may reference images not displayed]

ACR Breast Density Category b: There are scattered areas of
fibroglandular density.
FINDINGS: There are no findings suspicious for malignancy. Images were
processed with CAD.
IMPRESSION: No mammographic evidence of malignancy. A result letter of this
screening mammogram will be mailed directly to the patient.

RECOMMENDATION:
Screening mammogram in one year. (Code:AS-G-LCT)

BI-RADS CATEGORY  1: Negative.
